# Patient Record
Sex: Male | Born: 1965 | Race: White | Hispanic: No | Marital: Married | Smoking: Never smoker
Health system: Southern US, Community
[De-identification: ages and names within clinical notes are randomized; demographics above are authoritative.]

## PROBLEM LIST (undated history)

## (undated) DIAGNOSIS — Z87442 Personal history of urinary calculi: Secondary | ICD-10-CM

## (undated) DIAGNOSIS — L309 Dermatitis, unspecified: Secondary | ICD-10-CM

## (undated) HISTORY — PX: KNEE ARTHROSCOPY: SHX127

---

## 2002-10-04 ENCOUNTER — Emergency Department (HOSPITAL_COMMUNITY): Admission: EM | Admit: 2002-10-04 | Discharge: 2002-10-04 | Payer: Self-pay | Admitting: Emergency Medicine

## 2002-10-04 ENCOUNTER — Encounter: Payer: Self-pay | Admitting: Emergency Medicine

## 2004-04-12 ENCOUNTER — Emergency Department (HOSPITAL_COMMUNITY): Admission: EM | Admit: 2004-04-12 | Discharge: 2004-04-12 | Payer: Self-pay | Admitting: Emergency Medicine

## 2006-07-22 ENCOUNTER — Encounter: Admission: RE | Admit: 2006-07-22 | Discharge: 2006-07-22 | Payer: Self-pay | Admitting: Geriatric Medicine

## 2016-01-03 DIAGNOSIS — J111 Influenza due to unidentified influenza virus with other respiratory manifestations: Secondary | ICD-10-CM | POA: Diagnosis not present

## 2016-01-03 DIAGNOSIS — J0100 Acute maxillary sinusitis, unspecified: Secondary | ICD-10-CM | POA: Diagnosis not present

## 2016-02-24 DIAGNOSIS — N183 Chronic kidney disease, stage 3 (moderate): Secondary | ICD-10-CM | POA: Diagnosis not present

## 2016-02-24 DIAGNOSIS — Z0000 Encounter for general adult medical examination without abnormal findings: Secondary | ICD-10-CM | POA: Diagnosis not present

## 2016-02-24 DIAGNOSIS — Z125 Encounter for screening for malignant neoplasm of prostate: Secondary | ICD-10-CM | POA: Diagnosis not present

## 2016-02-24 DIAGNOSIS — Z79899 Other long term (current) drug therapy: Secondary | ICD-10-CM | POA: Diagnosis not present

## 2016-07-24 DIAGNOSIS — N132 Hydronephrosis with renal and ureteral calculous obstruction: Secondary | ICD-10-CM | POA: Diagnosis not present

## 2016-07-24 DIAGNOSIS — N289 Disorder of kidney and ureter, unspecified: Secondary | ICD-10-CM | POA: Diagnosis not present

## 2016-07-24 DIAGNOSIS — N201 Calculus of ureter: Secondary | ICD-10-CM | POA: Diagnosis not present

## 2016-08-31 DIAGNOSIS — L501 Idiopathic urticaria: Secondary | ICD-10-CM | POA: Diagnosis not present

## 2016-08-31 DIAGNOSIS — L299 Pruritus, unspecified: Secondary | ICD-10-CM | POA: Diagnosis not present

## 2017-01-31 DIAGNOSIS — L300 Nummular dermatitis: Secondary | ICD-10-CM | POA: Diagnosis not present

## 2017-01-31 DIAGNOSIS — L299 Pruritus, unspecified: Secondary | ICD-10-CM | POA: Diagnosis not present

## 2017-02-20 DIAGNOSIS — M79671 Pain in right foot: Secondary | ICD-10-CM | POA: Diagnosis not present

## 2017-03-03 ENCOUNTER — Inpatient Hospital Stay (HOSPITAL_COMMUNITY): Payer: BLUE CROSS/BLUE SHIELD | Admitting: Certified Registered Nurse Anesthetist

## 2017-03-03 ENCOUNTER — Other Ambulatory Visit: Payer: Self-pay | Admitting: Urology

## 2017-03-03 ENCOUNTER — Encounter (HOSPITAL_COMMUNITY)
Admission: AD | Disposition: A | Discharge status: Home / Self Care | Payer: Self-pay | Source: Ambulatory Visit | Attending: Urology

## 2017-03-03 ENCOUNTER — Ambulatory Visit (HOSPITAL_COMMUNITY): Payer: BLUE CROSS/BLUE SHIELD

## 2017-03-03 ENCOUNTER — Encounter (HOSPITAL_COMMUNITY): Payer: Self-pay | Admitting: *Deleted

## 2017-03-03 ENCOUNTER — Ambulatory Visit (HOSPITAL_COMMUNITY)
Admission: AD | Admit: 2017-03-03 | Discharge: 2017-03-03 | Disposition: A | Discharge status: Home / Self Care | Payer: BLUE CROSS/BLUE SHIELD | Source: Ambulatory Visit | Attending: Urology | Admitting: Urology

## 2017-03-03 ENCOUNTER — Other Ambulatory Visit: Payer: Self-pay | Admitting: Nurse Practitioner

## 2017-03-03 ENCOUNTER — Ambulatory Visit
Admission: RE | Admit: 2017-03-03 | Discharge: 2017-03-03 | Disposition: A | Discharge status: Home / Self Care | Payer: BLUE CROSS/BLUE SHIELD | Source: Ambulatory Visit | Attending: Nurse Practitioner | Admitting: Nurse Practitioner

## 2017-03-03 DIAGNOSIS — R339 Retention of urine, unspecified: Secondary | ICD-10-CM

## 2017-03-03 DIAGNOSIS — N132 Hydronephrosis with renal and ureteral calculous obstruction: Secondary | ICD-10-CM | POA: Insufficient documentation

## 2017-03-03 DIAGNOSIS — Z808 Family history of malignant neoplasm of other organs or systems: Secondary | ICD-10-CM | POA: Insufficient documentation

## 2017-03-03 DIAGNOSIS — Z8042 Family history of malignant neoplasm of prostate: Secondary | ICD-10-CM | POA: Insufficient documentation

## 2017-03-03 DIAGNOSIS — N201 Calculus of ureter: Secondary | ICD-10-CM | POA: Diagnosis not present

## 2017-03-03 DIAGNOSIS — Z832 Family history of diseases of the blood and blood-forming organs and certain disorders involving the immune mechanism: Secondary | ICD-10-CM | POA: Diagnosis not present

## 2017-03-03 DIAGNOSIS — Z87442 Personal history of urinary calculi: Secondary | ICD-10-CM | POA: Diagnosis not present

## 2017-03-03 DIAGNOSIS — N200 Calculus of kidney: Secondary | ICD-10-CM | POA: Diagnosis not present

## 2017-03-03 DIAGNOSIS — Z466 Encounter for fitting and adjustment of urinary device: Secondary | ICD-10-CM | POA: Diagnosis not present

## 2017-03-03 HISTORY — DX: Personal history of urinary calculi: Z87.442

## 2017-03-03 HISTORY — DX: Dermatitis, unspecified: L30.9

## 2017-03-03 HISTORY — PX: CYSTOSCOPY WITH RETROGRADE PYELOGRAM, URETEROSCOPY AND STENT PLACEMENT: SHX5789

## 2017-03-03 SURGERY — CYSTOURETEROSCOPY, WITH RETROGRADE PYELOGRAM AND STENT INSERTION
Anesthesia: General | Site: Ureter | Laterality: Right

## 2017-03-03 MED ORDER — 0.9 % SODIUM CHLORIDE (POUR BTL) OPTIME
TOPICAL | Status: DC | PRN
  Administered 2017-03-03: 1000 mL

## 2017-03-03 MED ORDER — ONDANSETRON HCL 4 MG/2ML IJ SOLN
INTRAMUSCULAR | Status: AC
  Filled 2017-03-03: qty 2

## 2017-03-03 MED ORDER — PROPOFOL 10 MG/ML IV BOLUS
INTRAVENOUS | Status: AC
  Filled 2017-03-03: qty 20

## 2017-03-03 MED ORDER — SCOPOLAMINE 1 MG/3DAYS TD PT72
MEDICATED_PATCH | TRANSDERMAL | Status: DC | PRN
  Administered 2017-03-03: 1 via TRANSDERMAL

## 2017-03-03 MED ORDER — PROPOFOL 10 MG/ML IV BOLUS
INTRAVENOUS | Status: DC | PRN
  Administered 2017-03-03: 100 mg via INTRAVENOUS
  Administered 2017-03-03: 200 mg via INTRAVENOUS

## 2017-03-03 MED ORDER — MIDAZOLAM HCL 5 MG/5ML IJ SOLN
INTRAMUSCULAR | Status: DC | PRN
  Administered 2017-03-03: 2 mg via INTRAVENOUS

## 2017-03-03 MED ORDER — OXYCODONE-ACETAMINOPHEN 5-325 MG PO TABS: 1.0000 | ORAL_TABLET | ORAL | 0 refills | Status: AC | PRN

## 2017-03-03 MED ORDER — FENTANYL CITRATE (PF) 100 MCG/2ML IJ SOLN
INTRAMUSCULAR | Status: DC | PRN
  Administered 2017-03-03: 100 ug via INTRAVENOUS

## 2017-03-03 MED ORDER — CEPHALEXIN 500 MG PO CAPS: 500.0000 mg | ORAL_CAPSULE | Freq: Three times a day (TID) | ORAL | 0 refills | Status: AC

## 2017-03-03 MED ORDER — FENTANYL CITRATE (PF) 100 MCG/2ML IJ SOLN
25.0000 ug | INTRAMUSCULAR | Status: DC | PRN
  Administered 2017-03-03 (×2): 50 ug via INTRAVENOUS

## 2017-03-03 MED ORDER — FENTANYL CITRATE (PF) 100 MCG/2ML IJ SOLN
INTRAMUSCULAR | Status: AC
  Filled 2017-03-03: qty 2

## 2017-03-03 MED ORDER — SODIUM CHLORIDE 0.9 % IR SOLN
Status: DC | PRN
  Administered 2017-03-03: 5000 mL

## 2017-03-03 MED ORDER — LACTATED RINGERS IV SOLN
INTRAVENOUS | Status: DC
  Administered 2017-03-03: 15:00:00 via INTRAVENOUS

## 2017-03-03 MED ORDER — PROMETHAZINE HCL 25 MG/ML IJ SOLN: 6.2500 mg | INTRAMUSCULAR | Status: DC | PRN

## 2017-03-03 MED ORDER — CEFAZOLIN SODIUM-DEXTROSE 2-4 GM/100ML-% IV SOLN
2.0000 g | INTRAVENOUS | Status: AC
  Administered 2017-03-03: 2 g via INTRAVENOUS
  Filled 2017-03-03: qty 100

## 2017-03-03 MED ORDER — SODIUM CHLORIDE 0.9 % IV SOLN
INTRAVENOUS | Status: DC | PRN
  Administered 2017-03-03: 10 mL

## 2017-03-03 MED ORDER — MIDAZOLAM HCL 2 MG/2ML IJ SOLN
INTRAMUSCULAR | Status: AC
  Filled 2017-03-03: qty 2

## 2017-03-03 MED ORDER — ONDANSETRON HCL 4 MG/2ML IJ SOLN
INTRAMUSCULAR | Status: DC | PRN
  Administered 2017-03-03: 4 mg via INTRAVENOUS

## 2017-03-03 MED ORDER — LIDOCAINE HCL (CARDIAC) 20 MG/ML IV SOLN
INTRAVENOUS | Status: DC | PRN
  Administered 2017-03-03: 50 mg via INTRAVENOUS

## 2017-03-03 MED ORDER — SCOPOLAMINE 1 MG/3DAYS TD PT72
MEDICATED_PATCH | TRANSDERMAL | Status: AC
  Filled 2017-03-03: qty 1

## 2017-03-03 MED ORDER — MEPERIDINE HCL 50 MG/ML IJ SOLN: 6.2500 mg | INTRAMUSCULAR | Status: DC | PRN

## 2017-03-03 MED ORDER — MIDAZOLAM HCL 2 MG/2ML IJ SOLN: 0.5000 mg | Freq: Once | INTRAMUSCULAR | Status: DC | PRN

## 2017-03-03 SURGICAL SUPPLY — 21 items
BAG URO CATCHER STRL LF (MISCELLANEOUS) ×4 IMPLANT
BASKET ZERO TIP NITINOL 2.4FR (BASKET) ×4 IMPLANT
CATH URET 5FR 28IN OPEN ENDED (CATHETERS) ×4 IMPLANT
CATH URET DUAL LUMEN 6-10FR 50 (CATHETERS) ×4 IMPLANT
CLOTH BEACON ORANGE TIMEOUT ST (SAFETY) ×4 IMPLANT
COVER SURGICAL LIGHT HANDLE (MISCELLANEOUS) ×4 IMPLANT
FIBER LASER FLEXIVA 365 (UROLOGICAL SUPPLIES) IMPLANT
FIBER LASER TRAC TIP (UROLOGICAL SUPPLIES) IMPLANT
GLOVE BIO SURGEON STRL SZ7.5 (GLOVE) ×4 IMPLANT
GLOVE BIOGEL PI IND STRL 7.0 (GLOVE) ×2 IMPLANT
GLOVE BIOGEL PI INDICATOR 7.0 (GLOVE) ×2
GLOVE SURG SS PI 7.0 STRL IVOR (GLOVE) ×4 IMPLANT
GOWN STRL REUS W/TWL LRG LVL3 (GOWN DISPOSABLE) ×8 IMPLANT
GUIDEWIRE ANG ZIPWIRE 038X150 (WIRE) IMPLANT
GUIDEWIRE STR DUAL SENSOR (WIRE) ×12 IMPLANT
MANIFOLD NEPTUNE II (INSTRUMENTS) ×4 IMPLANT
PACK CYSTO (CUSTOM PROCEDURE TRAY) ×4 IMPLANT
SHEATH ACCESS URETERAL 38CM (SHEATH) ×4 IMPLANT
STENT URET 6FRX26 CONTOUR (STENTS) ×4 IMPLANT
TUBING CONNECTING 10 (TUBING) ×3 IMPLANT
TUBING CONNECTING 10' (TUBING) ×1

## 2017-03-03 NOTE — Anesthesia Postprocedure Evaluation (Signed)
Anesthesia Post Note  Patient: Adrian Larsen  Procedure(s) Performed: Procedure(s) (LRB): CYSTOSCOPY WITH RETROGRADE PYELOGRAM, URETEROSCOPY AND STENT PLACEMENT (Right)     Patient location during evaluation: PACU Anesthesia Type: General Level of consciousness: awake and alert, patient cooperative and oriented Pain management: pain level controlled Vital Signs Assessment: post-procedure vital signs reviewed and stable Respiratory status: spontaneous breathing, nonlabored ventilation, respiratory function stable and patient connected to nasal cannula oxygen Cardiovascular status: blood pressure returned to baseline and stable Postop Assessment: no signs of nausea or vomiting Anesthetic complications: no    Last Vitals:  Vitals:   03/03/17 1736 03/03/17 1754  BP: 128/83 123/89  Pulse: 69 69  Resp: 16 16  Temp: 37.1 C 36.9 C    Last Pain:  Vitals:   03/03/17 1754  TempSrc: Oral  PainSc: 3                  Viha Kriegel,E. Zoraya Fiorenza

## 2017-03-03 NOTE — Anesthesia Preprocedure Evaluation (Signed)
Anesthesia Evaluation  Patient identified by MRN, date of birth, ID band Patient awake    Reviewed: Allergy & Precautions, NPO status , Patient's Chart, lab work & pertinent test results  History of Anesthesia Complications Negative for: history of anesthetic complications  Airway Mallampati: II  TM Distance: >3 FB Neck ROM: Full    Dental  (+) Dental Advisory Given   Pulmonary neg pulmonary ROS,    breath sounds clear to auscultation       Cardiovascular negative cardio ROS   Rhythm:Regular Rate:Normal     Neuro/Psych negative neurological ROS     GI/Hepatic Neg liver ROS, N/v with this stone   Endo/Other  negative endocrine ROS  Renal/GU negative Renal ROS     Musculoskeletal negative musculoskeletal ROS (+)   Abdominal   Peds  Hematology negative hematology ROS (+)   Anesthesia Other Findings   Reproductive/Obstetrics                             Anesthesia Physical Anesthesia Plan  ASA: I  Anesthesia Plan: General   Post-op Pain Management:    Induction: Intravenous and Rapid sequence  Airway Management Planned: Oral ETT  Additional Equipment:   Intra-op Plan:   Post-operative Plan: Extubation in OR  Informed Consent: I have reviewed the patients History and Physical, chart, labs and discussed the procedure including the risks, benefits and alternatives for the proposed anesthesia with the patient or authorized representative who has indicated his/her understanding and acceptance.   Dental advisory given  Plan Discussed with: CRNA and Surgeon  Anesthesia Plan Comments: (Plan routine monitors, GETA)        Anesthesia Quick Evaluation

## 2017-03-03 NOTE — H&P (Signed)
CC: I have ureteral stone.  HPI: Adrian Adrian is a 51 year-old male patient who was referred by Dr. Christiane Ha T. Farwell, MD who is here for ureteral stone.  The problem is on the right side. This is not his first kidney stone. He is currently having flank pain. He denies having back pain, groin pain, nausea, vomiting, fever, and chills. Pain is occuring on the right side. He has not caught a stone in his urine strainer since his symptoms began.   He has never had surgical treatment for calculi in the past.   Patient with 4 mm right ureteral stone near the UVJ. This pain STARTED this morning. He endorses right flank pain as well as urinary frequency. He endorses nausea and vomiting. He was given Demerol and Phenergan at his primary care provider's office which did not help. He did vomit during our discussion. He has no fevers or chills. His past for 5 stone spontaneously. His Adrian and I also have a significant history of kidney stones.   Urinalysis from his primary care provider office today is unremarkable except for trace blood. It had no sign of infection.     ALLERGIES: None   MEDICATIONS: Naproxen     GU PSH: Vasectomy    NON-GU PSH: Knee Arthroscopy, Left    GU PMH: Renal calculus    NON-GU PMH: None   FAMILY HISTORY: 2 daughters - Daughter 1 Adrian - Adrian Adrian - Mother Adrian Adrian - Mother Adrian Adrian - Father    Notes: 2 daughters; 1 Adrian   SOCIAL HISTORY: Marital Status: Married Current Smoking Status: Patient has never smoked.   Tobacco Use Assessment Completed: Used Tobacco in last 30 days? Patient's occupation Copywriter, advertising.    REVIEW OF SYSTEMS:    GU Review Male:   Patient denies frequent urination, hard to postpone urination, burning/ pain with urination, get up at night to urinate, leakage of urine, stream starts and stops, trouble starting your stream, have to strain to urinate , erection problems, and penile  pain.  Gastrointestinal (Upper):   Patient reports nausea. Patient denies vomiting and indigestion/ heartburn.  Gastrointestinal (Lower):   Patient denies diarrhea and constipation.  Constitutional:   Patient denies fever, night sweats, weight loss, and fatigue.  Skin:   Patient denies skin rash/ lesion and itching.  Eyes:   Patient denies blurred vision and double vision.  Ears/ Nose/ Throat:   Patient denies sore throat and sinus problems.  Hematologic/Lymphatic:   Patient denies swollen glands and easy bruising.  Cardiovascular:   Patient denies leg swelling and chest pains.  Respiratory:   Patient denies cough and shortness of breath.  Endocrine:   Patient denies excessive thirst.  Musculoskeletal:   Patient denies back pain and joint pain.  Neurological:   Patient denies headaches and dizziness.  Psychologic:   Patient denies depression and anxiety.   VITAL SIGNS:      03/03/2017 12:51 PM  Weight 184 lb / 83.46 kg  Height 67 in / 170.18 cm  BP 140/86 mmHg  Pulse 54 /min  Temperature 98.7 F / 37 C  BMI 28.8 kg/m   MULTI-SYSTEM PHYSICAL EXAMINATION:    Constitutional: Well-nourished. No physical deformities. Normally developed. Good grooming.  Neck: Neck symmetrical, not swollen. Normal tracheal position.  Respiratory: No labored breathing, no use of accessory muscles.   Cardiovascular: Normal temperature, normal extremity pulses, no swelling, no varicosities.  Lymphatic: No enlargement of neck, axillae, groin.  Skin: No  paleness, no jaundice, no cyanosis. No lesion, no ulcer, no rash.  Neurologic / Psychiatric: Oriented to time, oriented to place, oriented to person. No depression, no anxiety, no agitation.  Gastrointestinal: No mass, no tenderness, no rigidity, non obese abdomen. right cva tenderness  Eyes: Normal conjunctivae. Normal eyelids.  Ears, Nose, Mouth, and Throat: Left ear no scars, no lesions, no masses. Right ear no scars, no lesions, no masses. Nose no scars, no  lesions, no masses. Normal hearing. Normal lips.  Musculoskeletal: Normal gait and station of head and neck.     PAST DATA REVIEWED:  Source Of History:  Patient  Records Review:   Previous Doctor Records  X-Ray Review: [facility].T. Stone Protocol: Reviewed Films. Reviewed Report. Discussed With Patient.     PROCEDURES:          Ketoralac 74m - [id number]: 60 Adm. By: RMarin Roberts Unit: mg Lot No [id number]  Route: IM Exp. Date 07/12/2018  Freq: None Mfgr.:   Site: Left Buttock   ASSESSMENT:      ICD-10 Details  1 GU:   Ureteral calculus - N20.1    PLAN:           Document Letter(s):  Created for Patient: Clinical Summary   Created for Adrian T. Stoneking, MD    The patient and I talked about surgical treatment for their ureteral calculus. Etiologies of urinary calculi including dehydration, poor fluid intake, intake of well water, congenital renal disease, previous bowel surgery, idiopathic, and others were discussed with the patient.   Metabolic evaluation of ureteral stone disease was discussed with the patient. Alternative treatment options including increased water intake, increased lemonade intake, and dietary moderation with calcium and oxalate containing foods were discussed with the patient in detail.   The risks, benefits, and some of the possible complications of surgical treatments including cystoscopy, ureteroscopy, renoscopy, laser lithotripsy, extracorporeal Adrian Adrian lithotripsy, stent insertion and others were discussed with the patient. All questions were answered.  The patient gave fully informed consent to proceed with a ureteroscopy with or without laser lithotripsy with stone extraction for the treatment of their ureteral calculi.   The patient was given instructions to call for abdominal pain, pelvic pain, perirectal pain, nausea, vomiting, diarrhea, fever over 100 degrees F, chills, hematuria, or dysuria.        Notes:   I discussed with the patient  options including medical expulsive therapy and ureteroscopy. He is currently experiencing uncontrolled vomiting as well as uncontrolled pain. Celexa cystoscopy with right ureteroscopy which we will schedule in the next few hours.

## 2017-03-03 NOTE — Discharge Instructions (Signed)
Ureteral Stent Implantation, Care After °Refer to this sheet in the next few weeks. These instructions provide you with information about caring for yourself after your procedure. Your health care provider may also give you more specific instructions. Your treatment has been planned according to current medical practices, but problems sometimes occur. Call your health care provider if you have any problems or questions after your procedure. °What can I expect after the procedure? °After the procedure, it is common to have: °· Nausea. °· Mild pain when you urinate. You may feel this pain in your lower back or lower abdomen. Pain should stop within a few minutes after you urinate. This may last for up to 1 week. °· A small amount of blood in your urine for several days. ° °Follow these instructions at home: ° °Medicines °· Take over-the-counter and prescription medicines only as told by your health care provider. °· If you were prescribed an antibiotic medicine, take it as told by your health care provider. Do not stop taking the antibiotic even if you start to feel better. °· Do not drive for 24 hours if you received a sedative. °· Do not drive or operate heavy machinery while taking prescription pain medicines. °Activity °· Return to your normal activities as told by your health care provider. Ask your health care provider what activities are safe for you. °· Do not lift anything that is heavier than 10 lb (4.5 kg). Follow this limit for 1 week after your procedure, or for as long as told by your health care provider. °General instructions °· Watch for any blood in your urine. Call your health care provider if the amount of blood in your urine increases. °· If you have a catheter: °? Follow instructions from your health care provider about taking care of your catheter and collection bag. °? Do not take baths, swim, or use a hot tub until your health care provider approves. °· Drink enough fluid to keep your urine  clear or pale yellow. °· Keep all follow-up visits as told by your health care provider. This is important. °Contact a health care provider if: °· You have pain that gets worse or does not get better with medicine, especially pain when you urinate. °· You have difficulty urinating. °· You feel nauseous or you vomit repeatedly during a period of more than 2 days after the procedure. °Get help right away if: °· Your urine is dark red or has blood clots in it. °· You are leaking urine (have incontinence). °· The end of the stent comes out of your urethra. °· You cannot urinate. °· You have sudden, sharp, or severe pain in your abdomen or lower back. °· You have a fever. °This information is not intended to replace advice given to you by your health care provider. Make sure you discuss any questions you have with your health care provider. °Document Released: 05/22/2013 Document Revised: 02/25/2016 Document Reviewed: 04/03/2015 °Elsevier Interactive Patient Education © 2018 [employer] °General Anesthesia, Adult, Care After °These instructions provide you with information about caring for yourself after your procedure. Your health care provider may also give you more specific instructions. Your treatment has been planned according to current medical practices, but problems sometimes occur. Call your health care provider if you have any problems or questions after your procedure. °What can I expect after the procedure? °After the procedure, it is common to have: °· Vomiting. °· A sore throat. °· Mental slowness. ° °It is common to feel: °·   Nauseous. °· Cold or shivery. °· Sleepy. °· Tired. °· Sore or achy, even in parts of your body where you did not have surgery. ° °Follow these instructions at home: °For at least 24 hours after the procedure: °· Do not: °? Participate in activities where you could fall or become injured. °? Drive. °? Use heavy machinery. °? Drink alcohol. °? Take sleeping pills or medicines that  cause drowsiness. °? Make important decisions or sign legal documents. °? Take care of children on your own. °· Rest. °Eating and drinking °· If you vomit, drink water, juice, or soup when you can drink without vomiting. °· Drink enough fluid to keep your urine clear or pale yellow. °· Make sure you have little or no nausea before eating solid foods. °· Follow the diet recommended by your health care provider. °General instructions °· Have a responsible adult stay with you until you are awake and alert. °· Return to your normal activities as told by your health care provider. Ask your health care provider what activities are safe for you. °· Take over-the-counter and prescription medicines only as told by your health care provider. °· If you smoke, do not smoke without supervision. °· Keep all follow-up visits as told by your health care provider. This is important. °Contact a health care provider if: °· You continue to have nausea or vomiting at home, and medicines are not helpful. °· You cannot drink fluids or start eating again. °· You cannot urinate after 8-12 hours. °· You develop a skin rash. °· You have fever. °· You have increasing redness at the site of your procedure. °Get help right away if: °· You have difficulty breathing. °· You have chest pain. °· You have unexpected bleeding. °· You feel that you are having a life-threatening or urgent problem. °This information is not intended to replace advice given to you by your health care provider. Make sure you discuss any questions you have with your health care provider. °Document Released: 12/26/2000 Document Revised: 02/22/2016 Document Reviewed: 09/03/2015 °Elsevier Interactive Patient Education © 2018 [employer] °Cystoscopy, Care After °Refer to this sheet in the next few weeks. These instructions provide you with information about caring for yourself after your procedure. Your health care provider may also give you more specific instructions. Your  treatment has been planned according to current medical practices, but problems sometimes occur. Call your health care provider if you have any problems or questions after your procedure. °What can I expect after the procedure? °After the procedure, it is common to have: °· Mild pain when you urinate. Pain should stop within a few minutes after you urinate. This may last for up to 1 week. °· A small amount of blood in your urine for several days. °· Feeling like you need to urinate but producing only a small amount of urine. ° °Follow these instructions at home: ° °Medicines °· Take over-the-counter and prescription medicines only as told by your health care provider. °· If you were prescribed an antibiotic medicine, take it as told by your health care provider. Do not stop taking the antibiotic even if you start to feel better. °General instructions ° °· Return to your normal activities as told by your health care provider. Ask your health care provider what activities are safe for you. °· Do not drive for 24 hours if you received a sedative. °· Watch for any blood in your urine. If the amount of blood in your urine increases, call your health   care provider. °· Follow instructions from your health care provider about eating or drinking restrictions. °· If a tissue sample was removed for testing (biopsy) during your procedure, it is your responsibility to get your test results. Ask your health care provider or the department performing the test when your results will be ready. °· Drink enough fluid to keep your urine clear or pale yellow. °· Keep all follow-up visits as told by your health care provider. This is important. °Contact a health care provider if: °· You have pain that gets worse or does not get better with medicine, especially pain when you urinate. °· You have difficulty urinating. °Get help right away if: °· You have more blood in your urine. °· You have blood clots in your urine. °· You have abdominal  pain. °· You have a fever or chills. °· You are unable to urinate. °This information is not intended to replace advice given to you by your health care provider. Make sure you discuss any questions you have with your health care provider. °Document Released: 04/08/2005 Document Revised: 02/25/2016 Document Reviewed: 08/06/2015 °Elsevier Interactive Patient Education © 2017 [employer] ° °

## 2017-03-03 NOTE — Op Note (Signed)
Date of procedure: 03/03/17  Preoperative diagnosis:  1. Right ureteral stone   Postoperative diagnosis:  1. Right ureteral stone   Procedure: 1. Cystoscopy 2. Right ureteroscopy 3. Stone basketing 4. Right retrograde pyelogram interpretation 5. Right ureteral stent placement 6 French by 26 cm  Surgeon: Baruch Gouty, MD  Anesthesia: General  Complications: None  Intraoperative findings: The patient had a 3-4 mm stone in his right distal ureter that was removed with a stone basket. Right retrograde pyelogram at the end the procedure so no residual hydronephrosis.  EBL: None  Specimens: Stone to lab for analysis  Drains: Right ureteral stent 6 French by 26 cm  Disposition: Stable to the postanesthesia care unit  Indication for procedure: The patient is a 51 y.o. male with a 3-4 mm right distal ureteral stone who has poorly controlled pain presents today for definitive surgical management.  After reviewing the management options for treatment, the patient elected to proceed with the above surgical procedure(s). We have discussed the potential benefits and risks of the procedure, side effects of the proposed treatment, the likelihood of the patient achieving the goals of the procedure, and any potential problems that might occur during the procedure or recuperation. Informed consent has been obtained.  Description of procedure: The patient was met in the preoperative area. All risks, benefits, and indications of the procedure were described in great detail. The patient consented to the procedure. Preoperative antibiotics were given. The patient was taken to the operative theater. General anesthesia was induced per the anesthesia service. The patient was then placed in the dorsal lithotomy position and prepped and draped in the usual sterile fashion. A preoperative timeout was called.   A 21 French 30 cystoscope was inserted into the patient's bladder per urethra atraumatically. The  right ureter orifice was visualized and was sensor wires advanced the level of the right renal pelvis under fluoroscopy. Using a ureteroscopy, the right ureteral orifice was intubated and the stone was seen in the distal ureter and removed atraumatically with the stone basket. Right retrograde power was 0.7 residual hydronephrosis. With the aid of the cystoscope, 6 French by 26 under double-J ureteral stent was placed and the sensor wire was removed. A curl was seen in the patient's urinary bladder direct position and the and the right renal pelvis under fluoroscopy. Patient's bladder was drained and is woke from anesthesia and transferred in stable condition to the post anesthesia care unit.  Plan: The patient will follow-up in one week for stent removal. He'll need a renal ultrasound in 1 month to rule out iatrogenic hydronephrosis.  Baruch Gouty, M.D.

## 2017-03-03 NOTE — Transfer of Care (Signed)
Immediate Anesthesia Transfer of Care Note  Patient: Adrian Larsen  Procedure(s) Performed: Procedure(s): CYSTOSCOPY WITH RETROGRADE PYELOGRAM, URETEROSCOPY AND STENT PLACEMENT (Right)  Patient Location: PACU  Anesthesia Type:General  Level of Consciousness:  sedated, patient cooperative and responds to stimulation  Airway & Oxygen Therapy:Patient Spontanous Breathing and Patient connected to face mask oxgen  Post-op Assessment:  Report given to PACU RN and Post -op Vital signs reviewed and stable  Post vital signs:  Reviewed and stable  Last Vitals:  Vitals:   03/03/17 1409  BP: 120/81  Pulse: 62  Resp: 16  Temp: 36.9 C    Complications: No apparent anesthesia complications

## 2017-03-03 NOTE — Anesthesia Procedure Notes (Addendum)
Procedure Name: Intubation Date/Time: 03/03/2017 3:59 PM Performed by: Deliah Boston Pre-anesthesia Checklist: Patient identified, Emergency Drugs available, Suction available and Patient being monitored Patient Re-evaluated:Patient Re-evaluated prior to inductionOxygen Delivery Method: Circle system utilized Preoxygenation: Pre-oxygenation with 100% oxygen Intubation Type: IV induction, Rapid sequence and Cricoid Pressure applied Ventilation: Mask ventilation without difficulty Laryngoscope Size: Mac and 4 Grade View: Grade I Tube type: Oral Tube size: 7.5 mm Number of attempts: 1 Airway Equipment and Method: Stylet and Oral airway Placement Confirmation: ETT inserted through vocal cords under direct vision,  positive ETCO2 and breath sounds checked- equal and bilateral Secured at: 21 cm Tube secured with: Tape Dental Injury: Teeth and Oropharynx as per pre-operative assessment

## 2017-03-04 ENCOUNTER — Encounter (HOSPITAL_COMMUNITY): Payer: Self-pay | Admitting: Urology

## 2017-03-06 ENCOUNTER — Encounter (HOSPITAL_COMMUNITY): Payer: Self-pay | Admitting: Urology

## 2017-03-09 DIAGNOSIS — N201 Calculus of ureter: Secondary | ICD-10-CM | POA: Diagnosis not present

## 2017-04-12 DIAGNOSIS — N200 Calculus of kidney: Secondary | ICD-10-CM | POA: Diagnosis not present

## 2017-05-01 DIAGNOSIS — N183 Chronic kidney disease, stage 3 (moderate): Secondary | ICD-10-CM | POA: Diagnosis not present

## 2017-05-01 DIAGNOSIS — Z0000 Encounter for general adult medical examination without abnormal findings: Secondary | ICD-10-CM | POA: Diagnosis not present

## 2017-05-01 DIAGNOSIS — Z79899 Other long term (current) drug therapy: Secondary | ICD-10-CM | POA: Diagnosis not present

## 2017-05-01 DIAGNOSIS — Z136 Encounter for screening for cardiovascular disorders: Secondary | ICD-10-CM | POA: Diagnosis not present

## 2017-05-01 DIAGNOSIS — Z125 Encounter for screening for malignant neoplasm of prostate: Secondary | ICD-10-CM | POA: Diagnosis not present

## 2017-05-10 DIAGNOSIS — L237 Allergic contact dermatitis due to plants, except food: Secondary | ICD-10-CM | POA: Diagnosis not present

## 2017-08-17 DIAGNOSIS — L820 Inflamed seborrheic keratosis: Secondary | ICD-10-CM | POA: Diagnosis not present

## 2017-08-17 DIAGNOSIS — L300 Nummular dermatitis: Secondary | ICD-10-CM | POA: Diagnosis not present

## 2017-08-17 DIAGNOSIS — D044 Carcinoma in situ of skin of scalp and neck: Secondary | ICD-10-CM | POA: Diagnosis not present

## 2017-08-17 DIAGNOSIS — L299 Pruritus, unspecified: Secondary | ICD-10-CM | POA: Diagnosis not present

## 2017-11-15 DIAGNOSIS — B349 Viral infection, unspecified: Secondary | ICD-10-CM | POA: Diagnosis not present

## 2017-12-08 IMAGING — CT CT ABD-PELV W/O CM
2 of 4 series · 13 of 36 positions shown, 19 images · non-contrast
Comparison: CT Abdomen and Pelvis 07/24/2016.

CLINICAL DATA: 50-year-old male with umbilical pain, painful
urination and urinary retention since 3433 hours today. Prior kidney
stones.

EXAM:
CT ABDOMEN AND PELVIS WITHOUT CONTRAST
TECHNIQUE: Multidetector CT imaging of the abdomen and pelvis was performed
following the standard protocol without IV contrast.

[Series 601: coronal body · coronal · 0.94mm/px · 1 of 113 slices shown, 2 images]
[im 38/113  soft-tissue]
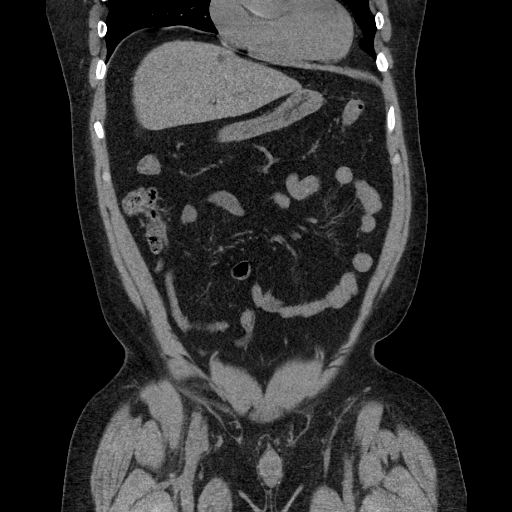
[im 38/113  bone]
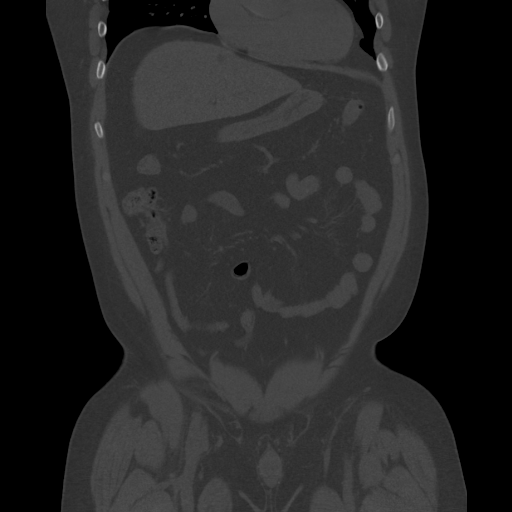

[Series 602: sagittal body · sagittal · 0.94mm/px · 12 of 161 slices shown, 17 images]
[im 9/161  soft-tissue]
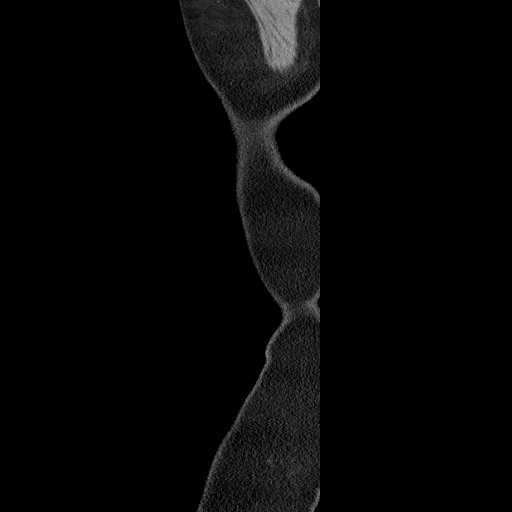
[im 9/161  lung]
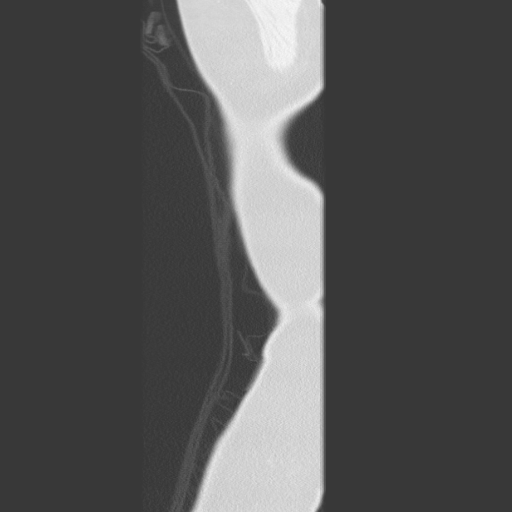
[im 9/161  bone]
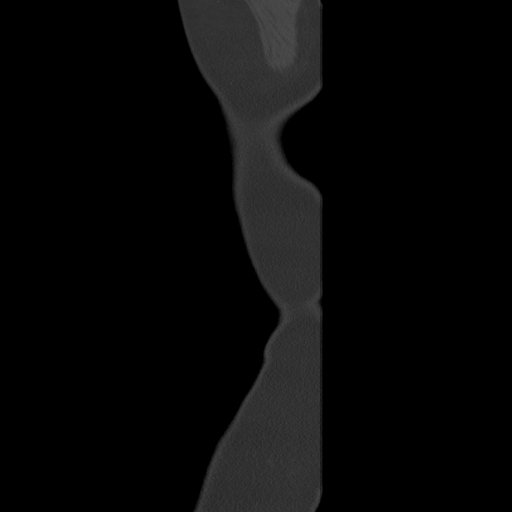
[im 18/161  lung]
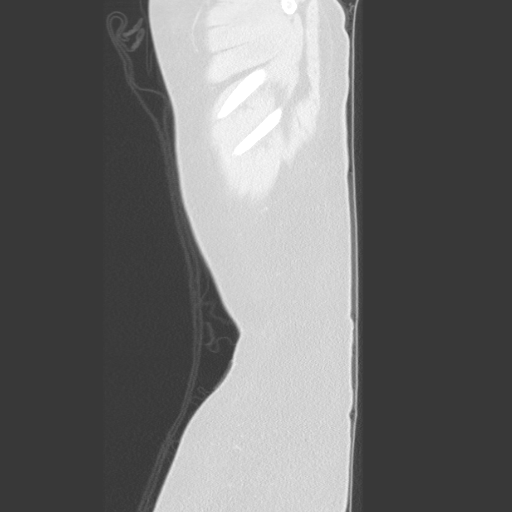
[im 27/161  soft-tissue]
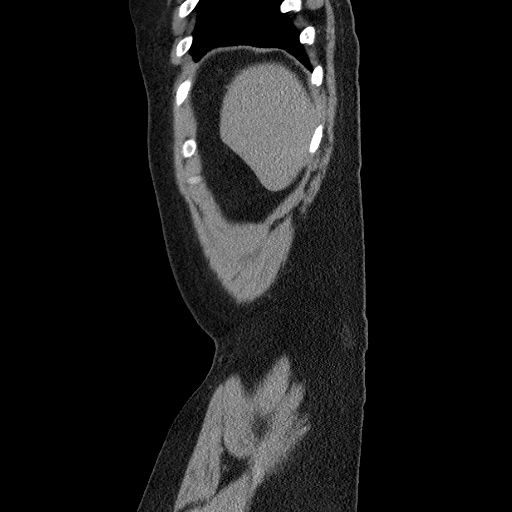
[im 27/161  lung]
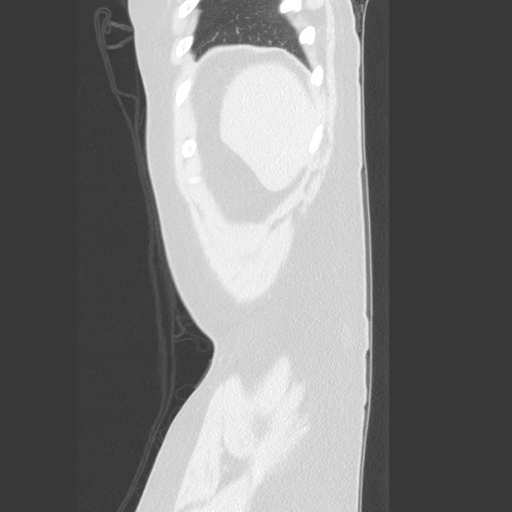
[im 36/161  soft-tissue]
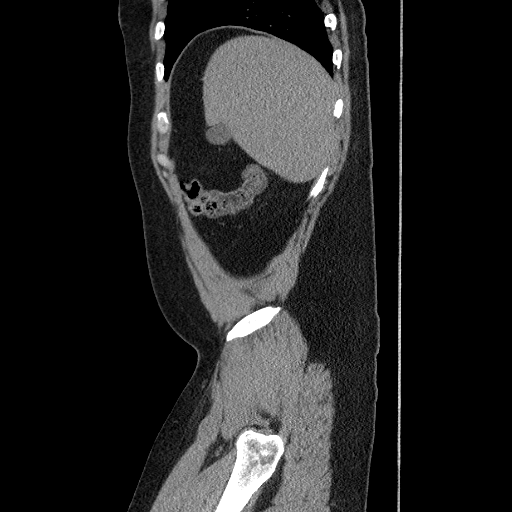
[im 36/161  lung]
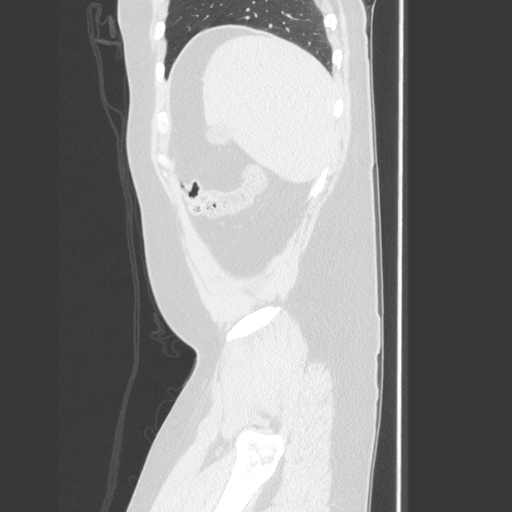
[im 54/161  soft-tissue]
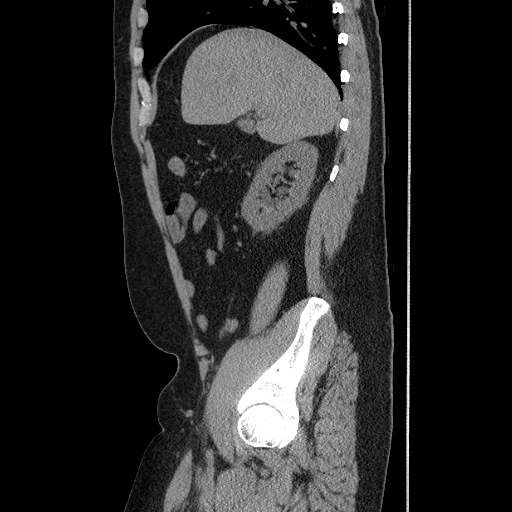
[im 63/161  soft-tissue]
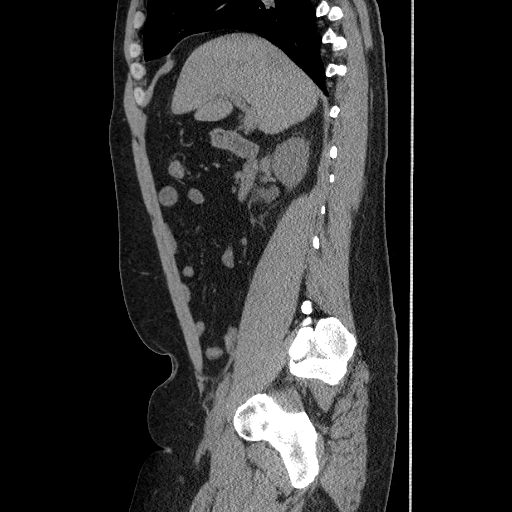
[im 81/161  soft-tissue]
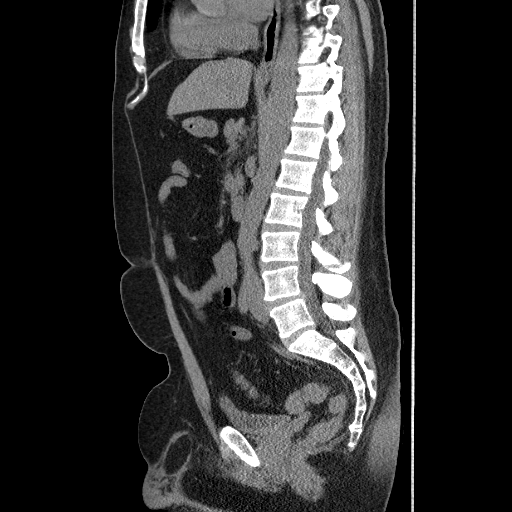
[im 98/161  soft-tissue]
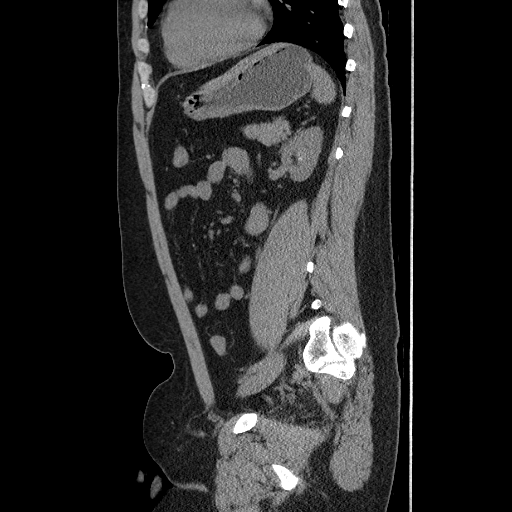
[im 107/161  soft-tissue]
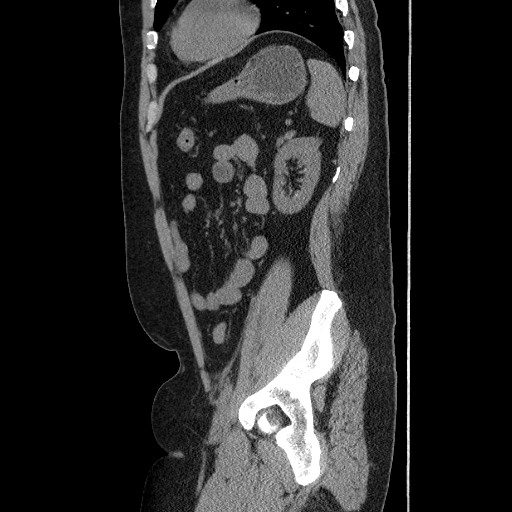
[im 125/161  soft-tissue]
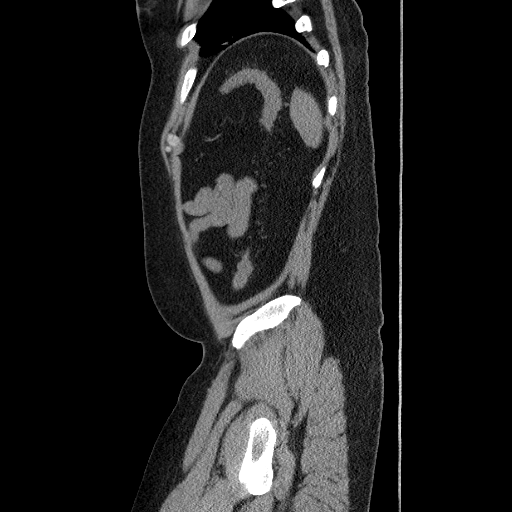
[im 125/161  bone]
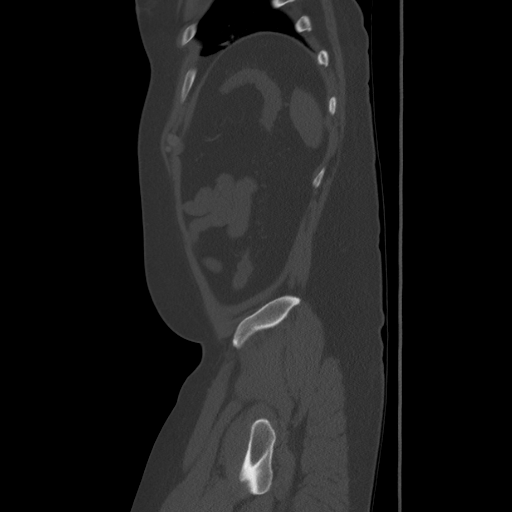
[im 134/161  soft-tissue]
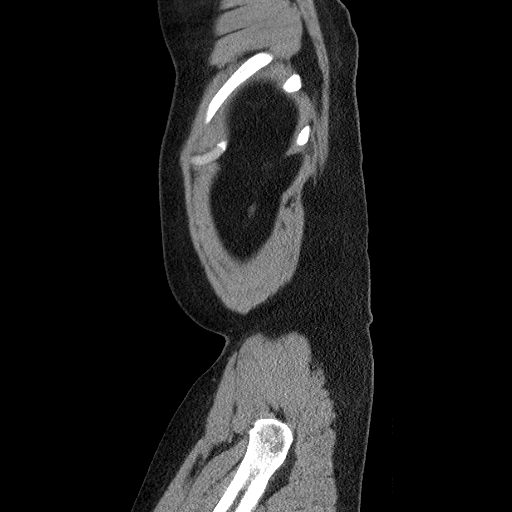
[im 152/161  soft-tissue]
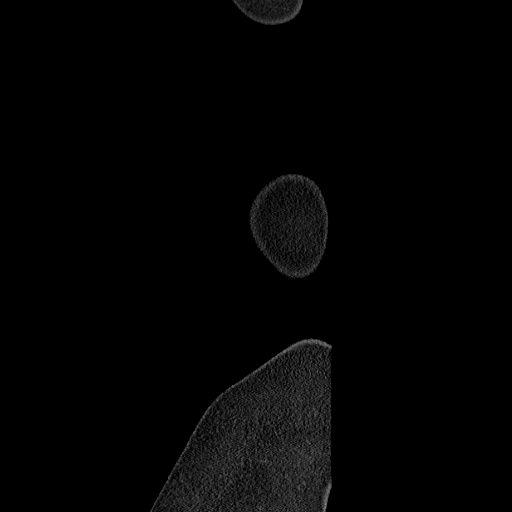

[13 of 36 positions shown; findings below may reference images not displayed]

FINDINGS: Lower chest: Stable and negative lung bases. No pericardial or
pleural effusion.

Hepatobiliary: Stable and negative noncontrast liver and gallbladder
(incidental small low-density area along the anterior right hepatic
dome appears benign).

Pancreas: Negative.

Spleen: Negative.

Adrenals/Urinary Tract: Normal adrenal glands.

Negative noncontrast left kidney and left ureter aside from punctate
nephrolithiasis.

Mild to moderate right hydronephrosis and perinephric stranding.
Multiple punctate intrarenal calculi. Right hydroureter and
periureteral stranding. The right ureter remains dilated to the
level of a 4 mm calculus located about 5 mm proximal to the right
ureterovesical junction. The urinary bladder is decompressed and
unremarkable.

Stomach/Bowel: Decompressed rectosigmoid colon, descending colon,
transverse colon and hepatic flexure. Negative right colon and
appendix. Negative terminal ileum. No dilated small bowel. Negative
stomach and duodenum.

No abdominal free fluid.

Vascular/Lymphatic: Vascular patency is not evaluated in the absence
of IV contrast. No lymphadenopathy.

Reproductive: Negative.

Other: No pelvic free fluid.

Musculoskeletal: No acute osseous abnormality identified. Mild
lumbar facet hypertrophy.
IMPRESSION: 1. Acute obstructive uropathy on the right. 4 mm distal right
ureteral calculus just proximal to the UVJ.
2. Numerous small bilateral renal calculi.
3. Otherwise negative noncontrast CT Abdomen and Pelvis.

## 2018-01-27 DIAGNOSIS — J209 Acute bronchitis, unspecified: Secondary | ICD-10-CM | POA: Diagnosis not present

## 2018-02-01 DIAGNOSIS — L300 Nummular dermatitis: Secondary | ICD-10-CM | POA: Diagnosis not present

## 2018-02-01 DIAGNOSIS — L570 Actinic keratosis: Secondary | ICD-10-CM | POA: Diagnosis not present

## 2018-02-01 DIAGNOSIS — L299 Pruritus, unspecified: Secondary | ICD-10-CM | POA: Diagnosis not present

## 2018-03-15 DIAGNOSIS — Z1211 Encounter for screening for malignant neoplasm of colon: Secondary | ICD-10-CM | POA: Diagnosis not present

## 2018-05-02 DIAGNOSIS — Z136 Encounter for screening for cardiovascular disorders: Secondary | ICD-10-CM | POA: Diagnosis not present

## 2018-05-02 DIAGNOSIS — Z125 Encounter for screening for malignant neoplasm of prostate: Secondary | ICD-10-CM | POA: Diagnosis not present

## 2018-05-02 DIAGNOSIS — Z0000 Encounter for general adult medical examination without abnormal findings: Secondary | ICD-10-CM | POA: Diagnosis not present

## 2019-01-05 DIAGNOSIS — S93601A Unspecified sprain of right foot, initial encounter: Secondary | ICD-10-CM | POA: Diagnosis not present

## 2019-01-21 DIAGNOSIS — M79671 Pain in right foot: Secondary | ICD-10-CM | POA: Diagnosis not present

## 2019-02-16 DIAGNOSIS — L820 Inflamed seborrheic keratosis: Secondary | ICD-10-CM | POA: Diagnosis not present

## 2019-06-03 DIAGNOSIS — E781 Pure hyperglyceridemia: Secondary | ICD-10-CM | POA: Diagnosis not present

## 2019-06-03 DIAGNOSIS — Z125 Encounter for screening for malignant neoplasm of prostate: Secondary | ICD-10-CM | POA: Diagnosis not present

## 2019-06-03 DIAGNOSIS — Z79899 Other long term (current) drug therapy: Secondary | ICD-10-CM | POA: Diagnosis not present

## 2019-06-03 DIAGNOSIS — Z0000 Encounter for general adult medical examination without abnormal findings: Secondary | ICD-10-CM | POA: Diagnosis not present

## 2019-06-03 DIAGNOSIS — N183 Chronic kidney disease, stage 3 (moderate): Secondary | ICD-10-CM | POA: Diagnosis not present

## 2019-06-14 DIAGNOSIS — Z23 Encounter for immunization: Secondary | ICD-10-CM | POA: Diagnosis not present

## 2019-07-09 DIAGNOSIS — M25562 Pain in left knee: Secondary | ICD-10-CM | POA: Diagnosis not present

## 2019-08-06 DIAGNOSIS — E781 Pure hyperglyceridemia: Secondary | ICD-10-CM | POA: Diagnosis not present

## 2019-08-26 DIAGNOSIS — M79671 Pain in right foot: Secondary | ICD-10-CM | POA: Diagnosis not present

## 2019-09-14 DIAGNOSIS — Z20828 Contact with and (suspected) exposure to other viral communicable diseases: Secondary | ICD-10-CM | POA: Diagnosis not present

## 2019-09-14 DIAGNOSIS — M791 Myalgia, unspecified site: Secondary | ICD-10-CM | POA: Diagnosis not present

## 2019-09-14 DIAGNOSIS — R05 Cough: Secondary | ICD-10-CM | POA: Diagnosis not present

## 2019-09-14 DIAGNOSIS — R519 Headache, unspecified: Secondary | ICD-10-CM | POA: Diagnosis not present

## 2019-09-25 DIAGNOSIS — M25561 Pain in right knee: Secondary | ICD-10-CM | POA: Diagnosis not present

## 2019-09-30 DIAGNOSIS — Z23 Encounter for immunization: Secondary | ICD-10-CM | POA: Diagnosis not present

## 2019-10-15 DIAGNOSIS — H903 Sensorineural hearing loss, bilateral: Secondary | ICD-10-CM | POA: Diagnosis not present

## 2019-10-15 DIAGNOSIS — H9113 Presbycusis, bilateral: Secondary | ICD-10-CM | POA: Diagnosis not present

## 2019-11-15 DIAGNOSIS — M7731 Calcaneal spur, right foot: Secondary | ICD-10-CM | POA: Diagnosis not present

## 2019-11-15 DIAGNOSIS — M79671 Pain in right foot: Secondary | ICD-10-CM | POA: Diagnosis not present

## 2019-12-09 DIAGNOSIS — M6701 Short Achilles tendon (acquired), right ankle: Secondary | ICD-10-CM | POA: Diagnosis not present

## 2019-12-09 DIAGNOSIS — M79671 Pain in right foot: Secondary | ICD-10-CM | POA: Diagnosis not present

## 2019-12-09 DIAGNOSIS — M7661 Achilles tendinitis, right leg: Secondary | ICD-10-CM | POA: Diagnosis not present

## 2019-12-18 DIAGNOSIS — M79671 Pain in right foot: Secondary | ICD-10-CM | POA: Diagnosis not present

## 2020-02-25 DIAGNOSIS — H5213 Myopia, bilateral: Secondary | ICD-10-CM | POA: Diagnosis not present

## 2020-02-25 DIAGNOSIS — H01005 Unspecified blepharitis left lower eyelid: Secondary | ICD-10-CM | POA: Diagnosis not present

## 2020-02-25 DIAGNOSIS — H0015 Chalazion left lower eyelid: Secondary | ICD-10-CM | POA: Diagnosis not present

## 2020-02-25 DIAGNOSIS — H01002 Unspecified blepharitis right lower eyelid: Secondary | ICD-10-CM | POA: Diagnosis not present

## 2020-03-06 DIAGNOSIS — L570 Actinic keratosis: Secondary | ICD-10-CM | POA: Diagnosis not present

## 2020-06-10 DIAGNOSIS — Z125 Encounter for screening for malignant neoplasm of prostate: Secondary | ICD-10-CM | POA: Diagnosis not present

## 2020-06-10 DIAGNOSIS — Z79899 Other long term (current) drug therapy: Secondary | ICD-10-CM | POA: Diagnosis not present

## 2020-06-10 DIAGNOSIS — Z0000 Encounter for general adult medical examination without abnormal findings: Secondary | ICD-10-CM | POA: Diagnosis not present

## 2020-06-10 DIAGNOSIS — E7800 Pure hypercholesterolemia, unspecified: Secondary | ICD-10-CM | POA: Diagnosis not present

## 2020-06-15 DIAGNOSIS — M7741 Metatarsalgia, right foot: Secondary | ICD-10-CM | POA: Diagnosis not present

## 2020-10-09 DIAGNOSIS — E7800 Pure hypercholesterolemia, unspecified: Secondary | ICD-10-CM | POA: Diagnosis not present

## 2020-11-06 DIAGNOSIS — L570 Actinic keratosis: Secondary | ICD-10-CM | POA: Diagnosis not present

## 2021-03-24 DIAGNOSIS — L570 Actinic keratosis: Secondary | ICD-10-CM | POA: Diagnosis not present

## 2021-03-24 DIAGNOSIS — L814 Other melanin hyperpigmentation: Secondary | ICD-10-CM | POA: Diagnosis not present

## 2021-03-24 DIAGNOSIS — L578 Other skin changes due to chronic exposure to nonionizing radiation: Secondary | ICD-10-CM | POA: Diagnosis not present

## 2021-03-24 DIAGNOSIS — L820 Inflamed seborrheic keratosis: Secondary | ICD-10-CM | POA: Diagnosis not present

## 2021-04-14 DIAGNOSIS — R221 Localized swelling, mass and lump, neck: Secondary | ICD-10-CM | POA: Diagnosis not present

## 2021-04-14 DIAGNOSIS — R600 Localized edema: Secondary | ICD-10-CM | POA: Diagnosis not present

## 2021-09-12 DIAGNOSIS — J069 Acute upper respiratory infection, unspecified: Secondary | ICD-10-CM | POA: Diagnosis not present

## 2021-09-12 DIAGNOSIS — R051 Acute cough: Secondary | ICD-10-CM | POA: Diagnosis not present

## 2022-02-24 DIAGNOSIS — Z125 Encounter for screening for malignant neoplasm of prostate: Secondary | ICD-10-CM | POA: Diagnosis not present

## 2022-02-24 DIAGNOSIS — E7800 Pure hypercholesterolemia, unspecified: Secondary | ICD-10-CM | POA: Diagnosis not present

## 2022-02-24 DIAGNOSIS — Z0000 Encounter for general adult medical examination without abnormal findings: Secondary | ICD-10-CM | POA: Diagnosis not present

## 2022-02-24 DIAGNOSIS — H6123 Impacted cerumen, bilateral: Secondary | ICD-10-CM | POA: Diagnosis not present

## 2022-02-24 DIAGNOSIS — N1831 Chronic kidney disease, stage 3a: Secondary | ICD-10-CM | POA: Diagnosis not present

## 2022-02-24 DIAGNOSIS — R7303 Prediabetes: Secondary | ICD-10-CM | POA: Diagnosis not present

## 2022-03-17 DIAGNOSIS — L821 Other seborrheic keratosis: Secondary | ICD-10-CM | POA: Diagnosis not present

## 2022-03-17 DIAGNOSIS — L578 Other skin changes due to chronic exposure to nonionizing radiation: Secondary | ICD-10-CM | POA: Diagnosis not present

## 2022-03-17 DIAGNOSIS — D225 Melanocytic nevi of trunk: Secondary | ICD-10-CM | POA: Diagnosis not present

## 2022-03-17 DIAGNOSIS — L814 Other melanin hyperpigmentation: Secondary | ICD-10-CM | POA: Diagnosis not present

## 2022-03-17 DIAGNOSIS — L570 Actinic keratosis: Secondary | ICD-10-CM | POA: Diagnosis not present

## 2022-04-04 DIAGNOSIS — M25562 Pain in left knee: Secondary | ICD-10-CM | POA: Diagnosis not present

## 2022-06-03 DIAGNOSIS — H52203 Unspecified astigmatism, bilateral: Secondary | ICD-10-CM | POA: Diagnosis not present

## 2022-06-03 DIAGNOSIS — H01005 Unspecified blepharitis left lower eyelid: Secondary | ICD-10-CM | POA: Diagnosis not present

## 2022-06-03 DIAGNOSIS — H2513 Age-related nuclear cataract, bilateral: Secondary | ICD-10-CM | POA: Diagnosis not present

## 2022-06-03 DIAGNOSIS — H01002 Unspecified blepharitis right lower eyelid: Secondary | ICD-10-CM | POA: Diagnosis not present

## 2022-09-28 DIAGNOSIS — M545 Low back pain, unspecified: Secondary | ICD-10-CM | POA: Diagnosis not present

## 2022-09-28 DIAGNOSIS — M47816 Spondylosis without myelopathy or radiculopathy, lumbar region: Secondary | ICD-10-CM | POA: Diagnosis not present

## 2022-09-28 DIAGNOSIS — S39012A Strain of muscle, fascia and tendon of lower back, initial encounter: Secondary | ICD-10-CM | POA: Diagnosis not present

## 2022-11-05 DIAGNOSIS — J019 Acute sinusitis, unspecified: Secondary | ICD-10-CM | POA: Diagnosis not present

## 2022-11-05 DIAGNOSIS — J209 Acute bronchitis, unspecified: Secondary | ICD-10-CM | POA: Diagnosis not present

## 2023-04-12 DIAGNOSIS — L570 Actinic keratosis: Secondary | ICD-10-CM | POA: Diagnosis not present

## 2023-04-12 DIAGNOSIS — L814 Other melanin hyperpigmentation: Secondary | ICD-10-CM | POA: Diagnosis not present

## 2023-04-12 DIAGNOSIS — D225 Melanocytic nevi of trunk: Secondary | ICD-10-CM | POA: Diagnosis not present

## 2023-04-12 DIAGNOSIS — L578 Other skin changes due to chronic exposure to nonionizing radiation: Secondary | ICD-10-CM | POA: Diagnosis not present

## 2023-04-12 DIAGNOSIS — R233 Spontaneous ecchymoses: Secondary | ICD-10-CM | POA: Diagnosis not present

## 2023-06-22 DIAGNOSIS — Z125 Encounter for screening for malignant neoplasm of prostate: Secondary | ICD-10-CM | POA: Diagnosis not present

## 2023-06-22 DIAGNOSIS — Z0000 Encounter for general adult medical examination without abnormal findings: Secondary | ICD-10-CM | POA: Diagnosis not present

## 2023-06-22 DIAGNOSIS — R0602 Shortness of breath: Secondary | ICD-10-CM | POA: Diagnosis not present

## 2023-06-22 DIAGNOSIS — R7989 Other specified abnormal findings of blood chemistry: Secondary | ICD-10-CM | POA: Diagnosis not present

## 2023-06-22 DIAGNOSIS — E7800 Pure hypercholesterolemia, unspecified: Secondary | ICD-10-CM | POA: Diagnosis not present

## 2023-06-22 DIAGNOSIS — R7303 Prediabetes: Secondary | ICD-10-CM | POA: Diagnosis not present

## 2023-06-22 DIAGNOSIS — N1831 Chronic kidney disease, stage 3a: Secondary | ICD-10-CM | POA: Diagnosis not present

## 2024-02-07 DIAGNOSIS — M722 Plantar fascial fibromatosis: Secondary | ICD-10-CM | POA: Diagnosis not present

## 2024-02-07 DIAGNOSIS — M6702 Short Achilles tendon (acquired), left ankle: Secondary | ICD-10-CM | POA: Diagnosis not present

## 2024-04-24 DIAGNOSIS — M6702 Short Achilles tendon (acquired), left ankle: Secondary | ICD-10-CM | POA: Diagnosis not present

## 2024-04-24 DIAGNOSIS — M722 Plantar fascial fibromatosis: Secondary | ICD-10-CM | POA: Diagnosis not present

## 2024-04-27 DIAGNOSIS — L570 Actinic keratosis: Secondary | ICD-10-CM | POA: Diagnosis not present

## 2024-04-27 DIAGNOSIS — D225 Melanocytic nevi of trunk: Secondary | ICD-10-CM | POA: Diagnosis not present

## 2024-04-27 DIAGNOSIS — C4442 Squamous cell carcinoma of skin of scalp and neck: Secondary | ICD-10-CM | POA: Diagnosis not present

## 2024-04-27 DIAGNOSIS — L821 Other seborrheic keratosis: Secondary | ICD-10-CM | POA: Diagnosis not present

## 2024-04-27 DIAGNOSIS — L578 Other skin changes due to chronic exposure to nonionizing radiation: Secondary | ICD-10-CM | POA: Diagnosis not present

## 2024-04-27 DIAGNOSIS — L814 Other melanin hyperpigmentation: Secondary | ICD-10-CM | POA: Diagnosis not present

## 2024-05-13 DIAGNOSIS — M722 Plantar fascial fibromatosis: Secondary | ICD-10-CM | POA: Diagnosis not present

## 2024-06-26 DIAGNOSIS — Z125 Encounter for screening for malignant neoplasm of prostate: Secondary | ICD-10-CM | POA: Diagnosis not present

## 2024-06-26 DIAGNOSIS — N1831 Chronic kidney disease, stage 3a: Secondary | ICD-10-CM | POA: Diagnosis not present

## 2024-06-26 DIAGNOSIS — Z0000 Encounter for general adult medical examination without abnormal findings: Secondary | ICD-10-CM | POA: Diagnosis not present

## 2024-06-26 DIAGNOSIS — E7800 Pure hypercholesterolemia, unspecified: Secondary | ICD-10-CM | POA: Diagnosis not present

## 2024-06-26 DIAGNOSIS — R7303 Prediabetes: Secondary | ICD-10-CM | POA: Diagnosis not present

## 2024-06-26 DIAGNOSIS — I10 Essential (primary) hypertension: Secondary | ICD-10-CM | POA: Diagnosis not present

## 2024-07-03 ENCOUNTER — Other Ambulatory Visit (HOSPITAL_BASED_OUTPATIENT_CLINIC_OR_DEPARTMENT_OTHER): Payer: Self-pay | Admitting: Internal Medicine

## 2024-07-03 DIAGNOSIS — E7800 Pure hypercholesterolemia, unspecified: Secondary | ICD-10-CM

## 2024-07-10 DIAGNOSIS — H52203 Unspecified astigmatism, bilateral: Secondary | ICD-10-CM | POA: Diagnosis not present

## 2024-07-10 DIAGNOSIS — H01002 Unspecified blepharitis right lower eyelid: Secondary | ICD-10-CM | POA: Diagnosis not present

## 2024-07-10 DIAGNOSIS — H01005 Unspecified blepharitis left lower eyelid: Secondary | ICD-10-CM | POA: Diagnosis not present

## 2024-07-18 ENCOUNTER — Ambulatory Visit (HOSPITAL_COMMUNITY)
Admission: RE | Admit: 2024-07-18 | Discharge: 2024-07-18 | Disposition: A | Discharge status: Home / Self Care | Payer: Self-pay | Source: Ambulatory Visit | Attending: Internal Medicine | Admitting: Internal Medicine

## 2024-07-18 DIAGNOSIS — E7800 Pure hypercholesterolemia, unspecified: Secondary | ICD-10-CM | POA: Insufficient documentation

## 2024-09-02 DIAGNOSIS — M7660 Achilles tendinitis, unspecified leg: Secondary | ICD-10-CM | POA: Diagnosis not present

## 2024-09-02 DIAGNOSIS — M722 Plantar fascial fibromatosis: Secondary | ICD-10-CM | POA: Diagnosis not present

## 2024-09-16 DIAGNOSIS — M6702 Short Achilles tendon (acquired), left ankle: Secondary | ICD-10-CM | POA: Diagnosis not present

## 2024-09-16 DIAGNOSIS — M722 Plantar fascial fibromatosis: Secondary | ICD-10-CM | POA: Diagnosis not present

## 2024-09-18 DIAGNOSIS — N1831 Chronic kidney disease, stage 3a: Secondary | ICD-10-CM | POA: Diagnosis not present

## 2024-09-18 DIAGNOSIS — R7303 Prediabetes: Secondary | ICD-10-CM | POA: Diagnosis not present

## 2024-09-18 DIAGNOSIS — E7800 Pure hypercholesterolemia, unspecified: Secondary | ICD-10-CM | POA: Diagnosis not present

## 2024-09-18 DIAGNOSIS — E663 Overweight: Secondary | ICD-10-CM | POA: Diagnosis not present
